# Patient Record
Sex: Female | Born: 2005 | Race: White | Hispanic: No | Marital: Single | Smoking: Never smoker
Health system: Southern US, Community
[De-identification: ages and names within clinical notes are randomized; demographics above are authoritative.]

## PROBLEM LIST (undated history)

## (undated) DIAGNOSIS — J302 Other seasonal allergic rhinitis: Secondary | ICD-10-CM

---
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
